# Patient Record
Sex: Female | Born: 1960 | Race: White | Hispanic: No | Marital: Married | Smoking: Never smoker
Health system: Southern US, Community
[De-identification: ages and names within clinical notes are randomized; demographics above are authoritative.]

## PROBLEM LIST (undated history)

## (undated) DIAGNOSIS — R5383 Other fatigue: Secondary | ICD-10-CM

## (undated) DIAGNOSIS — Z780 Asymptomatic menopausal state: Secondary | ICD-10-CM

## (undated) HISTORY — PX: ESSURE TUBAL LIGATION: SUR464

---

## 1998-03-26 ENCOUNTER — Other Ambulatory Visit: Admission: RE | Admit: 1998-03-26 | Discharge: 1998-03-26 | Payer: Self-pay | Admitting: *Deleted

## 1999-04-03 ENCOUNTER — Other Ambulatory Visit: Admission: RE | Admit: 1999-04-03 | Discharge: 1999-04-03 | Payer: Self-pay | Admitting: Obstetrics and Gynecology

## 2000-05-18 ENCOUNTER — Other Ambulatory Visit: Admission: RE | Admit: 2000-05-18 | Discharge: 2000-05-18 | Payer: Self-pay | Admitting: Obstetrics and Gynecology

## 2001-10-05 ENCOUNTER — Other Ambulatory Visit: Admission: RE | Admit: 2001-10-05 | Discharge: 2001-10-05 | Payer: Self-pay | Admitting: Obstetrics and Gynecology

## 2002-10-24 ENCOUNTER — Other Ambulatory Visit: Admission: RE | Admit: 2002-10-24 | Discharge: 2002-10-24 | Payer: Self-pay | Admitting: Obstetrics and Gynecology

## 2002-11-06 ENCOUNTER — Ambulatory Visit (HOSPITAL_COMMUNITY): Admission: RE | Admit: 2002-11-06 | Discharge: 2002-11-06 | Payer: Self-pay | Admitting: Obstetrics and Gynecology

## 2002-11-06 ENCOUNTER — Encounter: Payer: Self-pay | Admitting: Obstetrics and Gynecology

## 2003-10-30 ENCOUNTER — Other Ambulatory Visit: Admission: RE | Admit: 2003-10-30 | Discharge: 2003-10-30 | Payer: Self-pay | Admitting: Obstetrics and Gynecology

## 2004-12-11 ENCOUNTER — Other Ambulatory Visit: Admission: RE | Admit: 2004-12-11 | Discharge: 2004-12-11 | Payer: Self-pay | Admitting: Obstetrics and Gynecology

## 2005-10-04 ENCOUNTER — Inpatient Hospital Stay (HOSPITAL_COMMUNITY): Admission: AD | Admit: 2005-10-04 | Discharge: 2005-10-06 | Payer: Self-pay | Admitting: Obstetrics and Gynecology

## 2005-10-05 ENCOUNTER — Encounter (INDEPENDENT_AMBULATORY_CARE_PROVIDER_SITE_OTHER): Payer: Self-pay | Admitting: Cardiology

## 2010-03-10 ENCOUNTER — Ambulatory Visit (HOSPITAL_COMMUNITY): Admission: RE | Admit: 2010-03-10 | Discharge: 2010-03-10 | Payer: Self-pay | Admitting: Obstetrics and Gynecology

## 2010-10-09 NOTE — Consult Note (Signed)
Judy Bradley, Judy Bradley               ACCOUNT NO.:  [id number]   MEDICAL RECORD NO.:  [id number]          PATIENT TYPE:  INP   LOCATION:  9117                          FACILITY:  [facility]   PHYSICIAN:  Georga Hacking, M.D.DATE OF BIRTH:  May 11, 1961   DATE OF CONSULTATION:  10/05/2005  DATE OF DISCHARGE:                                   CONSULTATION   I was asked to see this 50 year old female for evaluation of PVCs.  The  patient previously has been in good health, assessment for fertility  problems, and had a laparoscopic surgery previously.  She has no known  history of cardiac problems and denies any prior history of angina, chest  pain, shortness of breath, PND, orthopnea, edema, syncope, palpitations, or  claudication.  She has a family history of cardiac disease previously.  She  came in and delivered last evening, and PVCs were noted on admission and  during labor.  She had an uncomplicated delivery, has felt fine since then  and has been asymptomatic.  I was asked to see her because of the  irregularity of her heart beat.   PAST MEDICAL HISTORY:  Benign.  There is no history of hypertension,  diabetes, or previous heart disease.   PREVIOUS SURGERY:  Laparoscopic surgery.   ALLERGIES:  None.   MEDICATIONS PRIOR TO ADMISSION:  Prenatal multivitamins and Colace.   FAMILY HISTORY:  Her father died of lung disease and had previous heart  attacks.  Mother had previous strokes.  There is a history of early heart  disease in her father as well as in her paternal grandparents, having had  heart disease in their 6s.   SOCIAL HISTORY:  She is a native of [place].  She has been married to  her current husband about 5 years, lives in [place].  She quit smoking  when she was a teenager.  She would drink socially at times previously.   REVIEW OF SYSTEMS:  Otherwise unremarkable.   PHYSICAL EXAMINATION:  GENERAL:  She is a pleasant, mildly obese female who  is currently in  no acute distress.  VITAL SIGNS:  Blood pressure 110/70, pulse 80 and irregular.  SKIN: Warm and dry.  ENT: Unremarkable.  Pharynx negative.  NECK:  Supple without masses, JVD, thyromegaly, or bruits.  LUNGS: Clear to auscultation and percussion.  CARDIAC: Irregular rhythm.  Normal S1 and S2.  There was no S3 and no click.  There was a soft 1/6 systolic murmur in the left sternal border.  ABDOMEN:  Shows her to be gravid.  EXTREMITIES:  Pulses were 2+.  There was no edema.   EKG was normal except for PVCs.   White count 21,000 on admission.   Echocardiogram reviewed shows normal left ventricular function with  estimated ejection fraction between 50 and 55%.  She has PVCs making  calculation of the EF difficult.  The aortic valve was normal.  The mitral  valve had mild mitral regurgitation.  Tricuspid valve had mild tricuspid  regurgitation.  Right ventricular function appeared normal.  There was no  pericardial effusion.  IMPRESSION:  1.  Premature ventricular contractions in a patient who is otherwise      asymptomatic and has no previous record of them.  2.  Positive family history of heart disease.   RECOMMENDATIONS:  The patient clinically has a normal cardiovascular  examination except for a flow murmur, has a normal echocardiogram and does  not have other organic evidence of heart disease.  She does have a family  history of premature cardiac disease but no other known risk factors. At  this point, I do not think she needs any further treatment for the PVCs as  they are asymptomatic. I think that she can be discharged in the morning and  was given instructions to avoid excess caffeine or decongestants.  I would  like to see her in followup in about 6 weeks to do a treadmill test because  of her family history of heart disease and to make certain there is no other  arrhythmia.  Also would recommend a set of cardiac enzymes, thyroid  functions, as well as chemistry  panel.   Thank you for asking me to see her with you.      Georga Hacking, M.D.  Electronically Signed     WST/MEDQ  D:  10/05/2005  T:  10/05/2005  Job:  [id number]   cc:   Marcelino Duster L. Vincente Poli, M.D.  Fax: [phone]

## 2011-09-22 IMAGING — RF DG HYSTEROGRAM
4 series · 4 of 4 positions shown · non-contrast
Comparison: none

CLINICAL DATA: Status post Essure placement

HYSTEROSALPINGOGRAM
TECHNIQUE: Hysterosalpingogram was performed by the ordering
physician under fluoroscopy.  Fluoroscopic images are submitted for
interpretation following the procedure.  The exam was performed by
Dr. Tiger, and four images are submitted for interpretation.

[Series 1: run · 1 of 1 slices shown (1 of 4)]
[im 1/1]
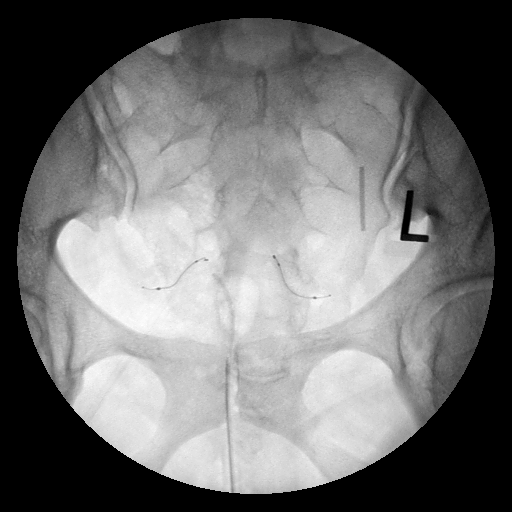

[Series 2: run · 1 of 1 slices shown (2 of 4)]
[im 1/1]
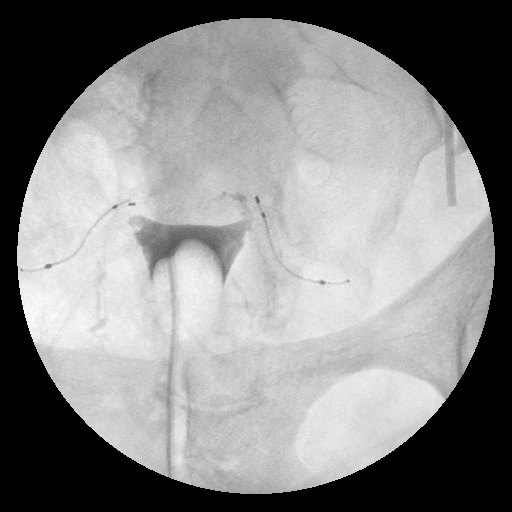

[Series 3: run · 1 of 1 slices shown (3 of 4)]
[im 1/1]
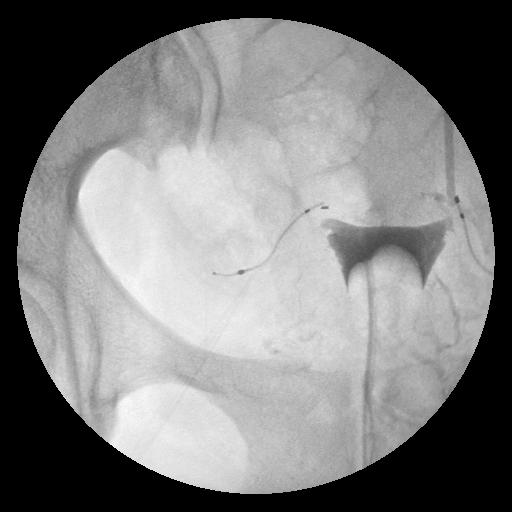

[Series 4: run · 1 of 1 slices shown (4 of 4)]
[im 1/1]
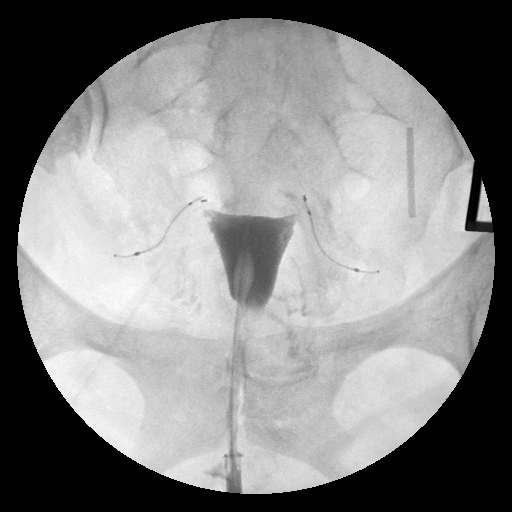

[4 of 4 positions shown; findings below may reference images not displayed]

FINDINGS: The Essure coils are in the expected location at the
cornual/interstitial portions of the tubes.  There is no contrast
within either tube.  A small amount of lymphovenous reflux is
noted.
IMPRESSION: Non patent tubes bilaterally with proper placement of the Essure
coils.

## 2013-03-24 HISTORY — PX: COLONOSCOPY: SHX174

## 2014-01-09 ENCOUNTER — Encounter (HOSPITAL_COMMUNITY): Payer: Self-pay

## 2014-01-14 NOTE — H&P (Signed)
Judy Bradley  DICTATION # [id number] CSN# [id number]   Meriel Pica, MD 01/14/2014 12:24 PM

## 2014-01-16 NOTE — H&P (Signed)
Judy Bradley, Judy Bradley               ACCOUNT NO.:  [id number]  MEDICAL RECORD NO.:  [id number]  LOCATION:                                 FACILITY:  PHYSICIAN:  Duke Salvia. Marcelle Overlie, M.D.DATE OF BIRTH:  20-Jan-1961  DATE OF ADMISSION:  01/21/2014 DATE OF DISCHARGE:                             HISTORY & PHYSICAL   CHIEF COMPLAINT:  Abnormal perimenopausal bleeding.  HISTORY OF PRESENT ILLNESS:  A 53 year old, G1, P1, prior Essure tubal who was recently started on HRT because of significant vasomotor symptoms.  Her symptoms improved substantially, but she developed some irregular bleeding, sonohysterogram in our office in August 2015 demonstrated adnexa negative, myometrium looked normal, well-defined 4 mm polyp was noted.  She presents now for D and C, hysteroscopy with resection.  This procedure including specific risks related to bleeding, infection, other complications that may require additional surgery were discussed with her, which she understands and accepts.  PAST MEDICAL HISTORY:  Allergies none.  CURRENT MEDICATIONS:  HRT.  OBSTETRICAL HISTORY:  One vaginal delivery in 2007.  FAMILY HISTORY:  Significant for history of heart disease, otherwise negative.  SOCIAL HISTORY:  Denies alcohol, tobacco, or drug use.  She is married.  PHYSICAL EXAMINATION:  VITAL SIGNS:  Temperature 98.2, blood pressure 120/78. HEENT:  Unremarkable. NECK:  Supple without masses. LUNGS:  Clear. CARDIOVASCULAR:  Regular rate and rhythm without murmurs, rubs, or gallops noted. BREASTS:  Without masses. ABDOMEN:  Soft, flat, nontender. GU:  Vulva, vagina, cervix normal.  Uterus mid position, normal size. Adnexa negative. EXTREMITIES:  Unremarkable. NEUROLOGIC:  Unremarkable.  IMPRESSION:  Abnormal perimenopausal bleeding, endometrial polyp.  PLAN:  D and C, hysteroscopy with resection.  Procedure and risks discussed as above.     Elvi Leventhal M. Marcelle Overlie, M.D.     [facility]/MEDQ  D:   01/14/2014  T:  01/14/2014  Job:  [id number]

## 2014-01-17 ENCOUNTER — Encounter (HOSPITAL_COMMUNITY): Payer: Self-pay | Admitting: Pharmacy Technician

## 2014-01-20 MED ORDER — DEXTROSE 5 % IV SOLN
2.0000 g | INTRAVENOUS | Status: AC
  Administered 2014-01-21: 2 g via INTRAVENOUS
  Filled 2014-01-20: qty 2

## 2014-01-21 ENCOUNTER — Encounter (HOSPITAL_COMMUNITY)
Admission: RE | Disposition: A | Discharge status: Home / Self Care | Payer: Self-pay | Source: Ambulatory Visit | Attending: Obstetrics and Gynecology

## 2014-01-21 ENCOUNTER — Encounter (HOSPITAL_COMMUNITY): Payer: BC Managed Care – PPO | Admitting: Anesthesiology

## 2014-01-21 ENCOUNTER — Ambulatory Visit (HOSPITAL_COMMUNITY): Payer: BC Managed Care – PPO | Admitting: Anesthesiology

## 2014-01-21 ENCOUNTER — Ambulatory Visit (HOSPITAL_COMMUNITY)
Admission: RE | Admit: 2014-01-21 | Discharge: 2014-01-21 | Disposition: A | Discharge status: Home / Self Care | Payer: BC Managed Care – PPO | Source: Ambulatory Visit | Attending: Obstetrics and Gynecology | Admitting: Obstetrics and Gynecology

## 2014-01-21 ENCOUNTER — Encounter (HOSPITAL_COMMUNITY): Payer: Self-pay | Admitting: *Deleted

## 2014-01-21 DIAGNOSIS — N840 Polyp of corpus uteri: Secondary | ICD-10-CM | POA: Insufficient documentation

## 2014-01-21 DIAGNOSIS — N938 Other specified abnormal uterine and vaginal bleeding: Secondary | ICD-10-CM | POA: Insufficient documentation

## 2014-01-21 DIAGNOSIS — N949 Unspecified condition associated with female genital organs and menstrual cycle: Secondary | ICD-10-CM | POA: Diagnosis not present

## 2014-01-21 HISTORY — DX: Asymptomatic menopausal state: Z78.0

## 2014-01-21 HISTORY — DX: Other fatigue: R53.83

## 2014-01-21 LAB — CBC
HEMATOCRIT: 39.3 % (ref 36.0–46.0)
Hemoglobin: 13.8 g/dL (ref 12.0–15.0)
MCH: 30.9 pg (ref 26.0–34.0)
MCHC: 35.1 g/dL (ref 30.0–36.0)
MCV: 88.1 fL (ref 78.0–100.0)
PLATELETS: 257 10*3/uL (ref 150–400)
RBC: 4.46 MIL/uL (ref 3.87–5.11)
RDW: 13.1 % (ref 11.5–15.5)
WBC: 11 10*3/uL — ABNORMAL HIGH (ref 4.0–10.5)

## 2014-01-21 LAB — PREGNANCY, URINE: PREG TEST UR: NEGATIVE

## 2014-01-21 SURGERY — DILATATION & CURETTAGE/HYSTEROSCOPY WITH MYOSURE
Anesthesia: General

## 2014-01-21 MED ORDER — KETOROLAC TROMETHAMINE 30 MG/ML IJ SOLN
INTRAMUSCULAR | Status: AC
  Filled 2014-01-21: qty 1

## 2014-01-21 MED ORDER — MIDAZOLAM HCL 2 MG/2ML IJ SOLN
INTRAMUSCULAR | Status: DC | PRN
  Administered 2014-01-21: 2 mg via INTRAVENOUS

## 2014-01-21 MED ORDER — DEXAMETHASONE SODIUM PHOSPHATE 10 MG/ML IJ SOLN
INTRAMUSCULAR | Status: AC
  Filled 2014-01-21: qty 1

## 2014-01-21 MED ORDER — PROPOFOL 10 MG/ML IV BOLUS
INTRAVENOUS | Status: DC | PRN
  Administered 2014-01-21: 200 mg via INTRAVENOUS

## 2014-01-21 MED ORDER — MEPERIDINE HCL 25 MG/ML IJ SOLN: 6.2500 mg | INTRAMUSCULAR | Status: DC | PRN

## 2014-01-21 MED ORDER — MIDAZOLAM HCL 2 MG/2ML IJ SOLN
INTRAMUSCULAR | Status: AC
  Filled 2014-01-21: qty 2

## 2014-01-21 MED ORDER — PROMETHAZINE HCL 25 MG/ML IJ SOLN: 6.2500 mg | INTRAMUSCULAR | Status: DC | PRN

## 2014-01-21 MED ORDER — HYDROCODONE-IBUPROFEN 7.5-200 MG PO TABS: 1.0000 | ORAL_TABLET | Freq: Three times a day (TID) | ORAL | Status: AC | PRN

## 2014-01-21 MED ORDER — FENTANYL CITRATE 0.05 MG/ML IJ SOLN
INTRAMUSCULAR | Status: AC
  Filled 2014-01-21: qty 2

## 2014-01-21 MED ORDER — FENTANYL CITRATE 0.05 MG/ML IJ SOLN
INTRAMUSCULAR | Status: DC | PRN
  Administered 2014-01-21 (×2): 50 ug via INTRAVENOUS

## 2014-01-21 MED ORDER — LIDOCAINE HCL (CARDIAC) 20 MG/ML IV SOLN
INTRAVENOUS | Status: DC | PRN
  Administered 2014-01-21: 80 mg via INTRAVENOUS

## 2014-01-21 MED ORDER — ONDANSETRON HCL 4 MG/2ML IJ SOLN
INTRAMUSCULAR | Status: DC | PRN
  Administered 2014-01-21: 4 mg via INTRAVENOUS

## 2014-01-21 MED ORDER — LACTATED RINGERS IV SOLN
INTRAVENOUS | Status: DC
  Administered 2014-01-21: 12:00:00 via INTRAVENOUS

## 2014-01-21 MED ORDER — DEXAMETHASONE SODIUM PHOSPHATE 10 MG/ML IJ SOLN
INTRAMUSCULAR | Status: DC | PRN
  Administered 2014-01-21: 4 mg via INTRAVENOUS

## 2014-01-21 MED ORDER — IBUPROFEN 200 MG PO TABS: 200.0000 mg | ORAL_TABLET | Freq: Three times a day (TID) | ORAL | Status: AC | PRN

## 2014-01-21 MED ORDER — SCOPOLAMINE 1 MG/3DAYS TD PT72
1.0000 | MEDICATED_PATCH | Freq: Once | TRANSDERMAL | Status: DC
  Administered 2014-01-21: 1.5 mg via TRANSDERMAL

## 2014-01-21 MED ORDER — LIDOCAINE HCL (CARDIAC) 20 MG/ML IV SOLN
INTRAVENOUS | Status: AC
  Filled 2014-01-21: qty 5

## 2014-01-21 MED ORDER — PROPOFOL 10 MG/ML IV EMUL
INTRAVENOUS | Status: AC
Start: 2014-01-21 — End: 2014-01-21
  Filled 2014-01-21: qty 20

## 2014-01-21 MED ORDER — SODIUM CHLORIDE 0.9 % IR SOLN
Status: DC | PRN
  Administered 2014-01-21: 3000 mL

## 2014-01-21 MED ORDER — FENTANYL CITRATE 0.05 MG/ML IJ SOLN: 25.0000 ug | INTRAMUSCULAR | Status: DC | PRN

## 2014-01-21 MED ORDER — KETOROLAC TROMETHAMINE 30 MG/ML IJ SOLN: 15.0000 mg | Freq: Once | INTRAMUSCULAR | Status: DC | PRN

## 2014-01-21 MED ORDER — KETOROLAC TROMETHAMINE 30 MG/ML IJ SOLN
INTRAMUSCULAR | Status: DC | PRN
  Administered 2014-01-21: 30 mg via INTRAVENOUS

## 2014-01-21 MED ORDER — SCOPOLAMINE 1 MG/3DAYS TD PT72
MEDICATED_PATCH | TRANSDERMAL | Status: AC
  Administered 2014-01-21: 1.5 mg via TRANSDERMAL
  Filled 2014-01-21: qty 1

## 2014-01-21 MED ORDER — LIDOCAINE HCL 1 % IJ SOLN
INTRAMUSCULAR | Status: DC | PRN
  Administered 2014-01-21: 9 mL

## 2014-01-21 MED ORDER — LIDOCAINE HCL 1 % IJ SOLN
INTRAMUSCULAR | Status: AC
  Filled 2014-01-21: qty 20

## 2014-01-21 MED ORDER — ONDANSETRON HCL 4 MG/2ML IJ SOLN
INTRAMUSCULAR | Status: AC
  Filled 2014-01-21: qty 2

## 2014-01-21 SURGICAL SUPPLY — 16 items
CATH ROBINSON RED A/P 16FR (CATHETERS) ×3 IMPLANT
CLOTH BEACON ORANGE TIMEOUT ST (SAFETY) ×3 IMPLANT
CONTAINER PREFILL 10% NBF 60ML (FORM) ×6 IMPLANT
DEVICE MYOSURE CLASSIC (MISCELLANEOUS) IMPLANT
DEVICE MYOSURE LITE (MISCELLANEOUS) IMPLANT
DRAPE HYSTEROSCOPY (DRAPE) ×3 IMPLANT
FILTER ARTHROSCOPY CONVERTOR (FILTER) ×3 IMPLANT
GLOVE BIO SURGEON STRL SZ7 (GLOVE) ×6 IMPLANT
GOWN STRL REUS W/TWL LRG LVL3 (GOWN DISPOSABLE) ×6 IMPLANT
PACK VAGINAL MINOR WOMEN LF (CUSTOM PROCEDURE TRAY) ×3 IMPLANT
PAD OB MATERNITY 4.3X12.25 (PERSONAL CARE ITEMS) ×3 IMPLANT
SEAL ROD LENS SCOPE MYOSURE (ABLATOR) ×3 IMPLANT
SET TUBING HYSTEROSCOPY 2 NDL (TUBING) ×3 IMPLANT
TOWEL OR 17X24 6PK STRL BLUE (TOWEL DISPOSABLE) ×6 IMPLANT
TUBE HYSTEROSCOPY W Y-CONNECT (TUBING) ×3 IMPLANT
WATER STERILE IRR 1000ML POUR (IV SOLUTION) ×3 IMPLANT

## 2014-01-21 NOTE — Progress Notes (Signed)
The patient was re-examined with no change in status 

## 2014-01-21 NOTE — Op Note (Signed)
Preoperative diagnosis: Abnormal uterine bleeding, endometrial polyp  Postoperative diagnosis: Same  Procedure: D&C, hysteroscopy with Myosure resection  Surgeon: Marcelle Overlie  Anesthesia: Gen.  Specimens removed: Endometrial curettings, to pathology  EBL: Less than 50 cc  Procedure and findings:  The patient was taken the operating room after an adequate level of general anesthesia was obtained the patient's legs in stirrups the perineum and vagina prepped and draped in usual fashion for D&C. The bladder was drained. EUA carried out, uterus midposition, mobile, normal size, adnexa negative. Procrit timeout for taken at that point.  Cervix was grasped with tenaculum paracervical block was then created by infiltrating at 3 and 9:00 submucosally, 5-7 cc 1% plain Xylocaine at each site after negative aspiration. The uterus is then sounded to 8 cm, progressively dilated to a 25 a 27 Pratt dilator. The continuous flow hysteroscope was inserted and has her hysteroscopy was carried out. Cannot identify any distinct polypoid tissue but there was some generalized endometrial buildup more prominent at the anterior fundal of wall. The smaller Mouser resector was then used to resect excess of tissue, there was no bleeding. Both tubal ostia could be seen at the and in the cavity was normal after removing the excessive endometrium anteriorly and posteriorly. Tissue fragments were submitted to pathology she tolerated this well went to recovery room in good condition.  Dictated with [facility]  Yusuf Yu M. Milana Obey.D.

## 2014-01-21 NOTE — Anesthesia Preprocedure Evaluation (Signed)
Anesthesia Evaluation  Patient identified by MRN, date of birth, ID band Patient awake    Reviewed: Allergy & Precautions, H&P , NPO status , Patient's Chart, lab work & pertinent test results, reviewed documented beta blocker date and time   History of Anesthesia Complications Negative for: history of anesthetic complications  Airway       Dental  (+) Teeth Intact   Pulmonary neg pulmonary ROS,  breath sounds clear to auscultation  Pulmonary exam normal       Cardiovascular negative cardio ROS  Rhythm:regular Rate:Normal     Neuro/Psych negative neurological ROS  negative psych ROS   GI/Hepatic negative GI ROS, Neg liver ROS,   Endo/Other  BMI 31.2  Renal/GU negative Renal ROS  Female GU complaint     Musculoskeletal   Abdominal   Peds  Hematology negative hematology ROS (+)   Anesthesia Other Findings   Reproductive/Obstetrics negative OB ROS                           Anesthesia Physical Anesthesia Plan  ASA: II  Anesthesia Plan: General LMA   Post-op Pain Management:    Induction:   Airway Management Planned:   Additional Equipment:   Intra-op Plan:   Post-operative Plan:   Informed Consent: I have reviewed the patients History and Physical, chart, labs and discussed the procedure including the risks, benefits and alternatives for the proposed anesthesia with the patient or authorized representative who has indicated his/her understanding and acceptance.   Dental Advisory Given  Plan Discussed with: CRNA and Surgeon  Anesthesia Plan Comments:         Anesthesia Quick Evaluation

## 2014-01-21 NOTE — Discharge Instructions (Signed)
DISCHARGE INSTRUCTIONS: HYSTEROSCOPY / ENDOMETRIAL ABLATION The following instructions have been prepared to help you care for yourself upon your return home.  **YOu may begin taking Ibuprofen containing medications after 7:37 pm today**  Personal hygiene:  Use sanitary pads for vaginal drainage, not tampons.  Shower the day after your procedure.  NO tub baths, pools or Jacuzzis for 2-3 weeks.  Wipe front to back after using the bathroom.  Activity and limitations:  Do NOT drive or operate any equipment for 24 hours. The effects of anesthesia are still present and drowsiness may result.  Do NOT rest in bed all day.  Walking is encouraged.  Walk up and down stairs slowly.  You may resume your normal activity in one to two days or as indicated by your physician. Sexual activity: NO intercourse for at least 2 weeks after the procedure, or as indicated by your Doctor.  Diet: Eat a light meal as desired this evening. You may resume your usual diet tomorrow.  Return to Work: You may resume your work activities in one to two days or as indicated by Therapist, sports.  What to expect after your surgery: Expect to have vaginal bleeding/discharge for 2-3 days and spotting for up to 10 days. It is not unusual to have soreness for up to 1-2 weeks. You may have a slight burning sensation when you urinate for the first day. Mild cramps may continue for a couple of days. You may have a regular period in 2-6 weeks.  Call your doctor for any of the following:  Excessive vaginal bleeding or clotting, saturating and changing one pad every hour.  Inability to urinate 6 hours after discharge from hospital.  Pain not relieved by pain medication.  Fever of 100.4 F or greater.  Unusual vaginal discharge or odor.   Patients signature: ______________________  Nurses signature ________________________  Support person's signature_______________________

## 2014-01-21 NOTE — Transfer of Care (Signed)
Immediate Anesthesia Transfer of Care Note  Patient: Judy Bradley  Procedure(s) Performed: Procedure(s): DILATATION & CURETTAGE/HYSTEROSCOPY WITH MYOSURE ABLATION (N/A)  Patient Location: PACU  Anesthesia Type:General  Level of Consciousness: awake, alert  and oriented  Airway & Oxygen Therapy: Patient Spontanous Breathing and Patient connected to nasal cannula oxygen  Post-op Assessment: Report given to PACU RN, Post -op Vital signs reviewed and stable and Patient moving all extremities  Post vital signs: Reviewed and stable  Complications: No apparent anesthesia complications

## 2014-01-21 NOTE — Anesthesia Postprocedure Evaluation (Signed)
  Anesthesia Post-op Note  Anesthesia Post Note  Patient: Judy Bradley  Procedure(s) Performed: Procedure(s) (LRB): DILATATION & CURETTAGE/HYSTEROSCOPY WITH MYOSURE ABLATION (N/A)  Anesthesia type: General  Patient location: PACU  Post pain: Pain level controlled  Post assessment: Post-op Vital signs reviewed  Last Vitals:  Filed Vitals:   01/21/14 1450  BP:   Pulse:   Temp: 36.5 C  Resp:     Post vital signs: Reviewed  Level of consciousness: sedated  Complications: No apparent anesthesia complications

## 2014-02-14 ENCOUNTER — Other Ambulatory Visit: Payer: Self-pay | Admitting: Obstetrics and Gynecology

## 2014-02-15 LAB — CYTOLOGY - PAP

## 2015-01-28 ENCOUNTER — Other Ambulatory Visit: Payer: Self-pay | Admitting: Obstetrics and Gynecology

## 2015-01-30 LAB — CYTOLOGY - PAP

## 2017-10-13 ENCOUNTER — Other Ambulatory Visit: Payer: Self-pay | Admitting: Obstetrics and Gynecology

## 2017-10-13 DIAGNOSIS — Z803 Family history of malignant neoplasm of breast: Secondary | ICD-10-CM

## 2017-10-31 ENCOUNTER — Ambulatory Visit
Admission: RE | Admit: 2017-10-31 | Discharge: 2017-10-31 | Disposition: A | Discharge status: Home / Self Care | Payer: BLUE CROSS/BLUE SHIELD | Source: Ambulatory Visit | Attending: Obstetrics and Gynecology | Admitting: Obstetrics and Gynecology

## 2017-10-31 DIAGNOSIS — Z803 Family history of malignant neoplasm of breast: Secondary | ICD-10-CM

## 2017-10-31 MED ORDER — GADOBENATE DIMEGLUMINE 529 MG/ML IV SOLN
15.0000 mL | Freq: Once | INTRAVENOUS | Status: AC | PRN
  Administered 2017-10-31: 15 mL via INTRAVENOUS
